# Patient Record
Sex: Female | Born: 1995
Health system: Southern US, Community
[De-identification: ages and names within clinical notes are randomized; demographics above are authoritative.]

## PROBLEM LIST (undated history)

## (undated) DIAGNOSIS — L309 Dermatitis, unspecified: Secondary | ICD-10-CM

## (undated) HISTORY — DX: Dermatitis, unspecified: L30.9

---
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
## 2018-09-27 ENCOUNTER — Other Ambulatory Visit: Payer: Self-pay

## 2018-09-28 ENCOUNTER — Encounter: Payer: Self-pay | Admitting: Women's Health

## 2018-09-28 ENCOUNTER — Ambulatory Visit: Payer: Managed Care, Other (non HMO) | Admitting: Women's Health

## 2018-09-28 VITALS — BP 112/78 | Ht 63.5 in | Wt 135.0 lb

## 2018-09-28 DIAGNOSIS — Z113 Encounter for screening for infections with a predominantly sexual mode of transmission: Secondary | ICD-10-CM

## 2018-09-28 DIAGNOSIS — Z975 Presence of (intrauterine) contraceptive device: Secondary | ICD-10-CM | POA: Diagnosis not present

## 2018-09-28 DIAGNOSIS — Z01419 Encounter for gynecological examination (general) (routine) without abnormal findings: Secondary | ICD-10-CM | POA: Diagnosis not present

## 2018-09-28 DIAGNOSIS — N938 Other specified abnormal uterine and vaginal bleeding: Secondary | ICD-10-CM

## 2018-09-28 LAB — PREGNANCY, URINE: Preg Test, Ur: NEGATIVE

## 2018-09-28 MED ORDER — MEGESTROL ACETATE 40 MG PO TABS: 40.0000 mg | ORAL_TABLET | Freq: Two times a day (BID) | ORAL | 0 refills | Status: DC

## 2018-09-28 NOTE — Patient Instructions (Signed)

## 2018-09-28 NOTE — Progress Notes (Signed)
GWENDOLYNN RAAK 30-Jul-1995 [id number]    History:    Presents for annual exam.  01/2016 Nexplanon placed, first year had a cycle every 3 to 4 months for 1 week second year mostly monthly cycles, this month has had a cycle now for 3 weeks with many heavy days.  Prior to Nexplanon had monthly 5 to 7-day cycles.  Gardasil series completed.  New partner in the past year.  Past medical history, past surgical history, family history and social history were all reviewed and documented in the EPIC chart.  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  Parents healthy.  ROS:  A ROS was performed and pertinent positives and negatives are included.  Exam:  Vitals:   09/28/18 1019  BP: 112/78  Weight: 135 lb (61.2 kg)  Height: 5' 3.5" (1.613 m)   Body mass index is 23.54 kg/m.   General appearance:  Normal Thyroid:  Symmetrical, normal in size, without palpable masses or nodularity. Respiratory  Auscultation:  Clear without wheezing or rhonchi Cardiovascular  Auscultation:  Regular rate, without rubs, murmurs or gallops  Edema/varicosities:  Not grossly evident Abdominal  Soft,nontender, without masses, guarding or rebound.  Liver/spleen:  No organomegaly noted  Hernia:  None appreciated  Skin  Inspection:  Grossly normal   Breasts: Examined lying and sitting.     Right: Without masses, retractions, discharge or axillary adenopathy.     Left: Without masses, retractions, discharge or axillary adenopathy. Gentitourinary   Inguinal/mons:  Normal without inguinal adenopathy  External genitalia:  Normal  BUS/Urethra/Skene's glands:  Normal  Vagina:  Normal, scant menses  Cervix:  Normal  Uterus:  normal in size, shape and contour.  Midline and mobile  Adnexa/parametria:     Rt: Without masses or tenderness.   Lt: Without masses or tenderness.  Anus and perineum: Normal  UPT negative   Assessment/Plan:  23 y.o. SBF G0 for annual exam with current 3-week  cycle  on Nexplanon.  01/2016 Nexplanon placed at family-planning STD screen  Plan: Options reviewed, discussed minimal bleeding today, prescription for Megace 40 mg twice daily for 10 days given if occurs again prior to August when returns to office for removal and replacement.   Other contraception options reviewed would like to proceed with Nexplanon again. Schedule appointment for Nexplanon to be removed and replaced with Dr. Seymour Bars in August.  Condoms encouraged until permanent partner.  SBEs, exercise, calcium rich foods, MVI daily encouraged.  CBC, GC/chlamydia, HIV, RPR.  Pap.  New screening guidelines reviewed.    Harrington Challenger [facility], 10:39 AM 09/28/2018

## 2018-09-29 LAB — PAP IG W/ RFLX HPV ASCU

## 2018-09-29 LAB — C. TRACHOMATIS/N. GONORRHOEAE RNA
C. trachomatis RNA, TMA: NOT DETECTED
N. gonorrhoeae RNA, TMA: NOT DETECTED

## 2018-10-01 ENCOUNTER — Other Ambulatory Visit: Payer: Self-pay | Admitting: *Deleted

## 2018-10-01 MED ORDER — METRONIDAZOLE 500 MG PO TABS: 500.0000 mg | ORAL_TABLET | Freq: Two times a day (BID) | ORAL | 0 refills | Status: DC

## 2018-10-18 ENCOUNTER — Ambulatory Visit: Payer: Managed Care, Other (non HMO) | Admitting: Women's Health

## 2019-02-07 ENCOUNTER — Encounter: Payer: Self-pay | Admitting: Obstetrics & Gynecology

## 2019-02-07 ENCOUNTER — Other Ambulatory Visit: Payer: Self-pay

## 2019-02-07 ENCOUNTER — Ambulatory Visit: Payer: Managed Care, Other (non HMO) | Admitting: Obstetrics & Gynecology

## 2019-02-07 VITALS — BP 110/78

## 2019-02-07 DIAGNOSIS — Z30013 Encounter for initial prescription of injectable contraceptive: Secondary | ICD-10-CM

## 2019-02-07 DIAGNOSIS — Z3009 Encounter for other general counseling and advice on contraception: Secondary | ICD-10-CM

## 2019-02-07 DIAGNOSIS — Z3049 Encounter for surveillance of other contraceptives: Secondary | ICD-10-CM | POA: Diagnosis not present

## 2019-02-07 DIAGNOSIS — N938 Other specified abnormal uterine and vaginal bleeding: Secondary | ICD-10-CM

## 2019-02-07 DIAGNOSIS — Z3046 Encounter for surveillance of implantable subdermal contraceptive: Secondary | ICD-10-CM

## 2019-02-07 MED ORDER — MEDROXYPROGESTERONE ACETATE 150 MG/ML IM SUSP: 150.0000 mg | INTRAMUSCULAR | 4 refills | Status: DC

## 2019-02-07 MED ORDER — MEDROXYPROGESTERONE ACETATE 150 MG/ML IM SUSP
150.0000 mg | Freq: Once | INTRAMUSCULAR | Status: AC
  Administered 2019-02-07: 150 mg via INTRAMUSCULAR

## 2019-02-07 NOTE — Progress Notes (Signed)
° ° °  ROTHA CASSELS October 07, 1995 [id number]        23 y.o.  G0P0000 Single  RP: Nexplanon removal/contraception management  HPI: Nexplanon x 01/2016 with increased BTB recently.  No pelvic pain.  Interested in [place].   OB History  Gravida Para Term Preterm AB Living  0 0 0 0 0 0  SAB TAB Ectopic Multiple Live Births  0 0 0 0 0    Past medical history,surgical history, problem list, medications, allergies, family history and social history were all reviewed and documented in the EPIC chart.   Directed ROS with pertinent positives and negatives documented in the history of present illness/assessment and plan.  Exam:  Vitals:   02/07/19 1449  BP: 110/78   General appearance:  Normal                                                             Nexplanon procedure note (removal)  The patient presented to the office today requesting for removal of her Nexplanon that was placed in the year 2017 on her left arm.   On examination the nexplanon implant was palpated and the distal end  (end  closest to the elbow) was marked. The area was sterilized with Betadine solution. 1% lidocaine was used for local anesthesia and approximately 1 cc  was injected into the site that was marked where the incision was to be made. The local anesthetic was injected under the implant in an effort to keep it  close to the skin surface. Slight pressure pushing downward was made at the proximal end  of the implant in an effort to stabilize it. A bulge appeared indicating the distal end of the implant. A small transverse incision of 2 mm was made at that location. By gently pushing the implant toward the incision, the tip became visible. Grasping the implant with a curved forcep facilitated in gently removing the implant. Full confirmation of the entire implant which is 4 cm long was inspected and was intact and was shown to the patient and discarded. After removing the implant, the incision was closed with  3Steri-Strips, a band-aid and a bandage. Patient will be instructed to remove the pressure bandage in 24 hours, the band-aid in 3 days and the Steri-Strips in 7 days.    Assessment/Plan:  23 y.o. G0  1. Encounter for Nexplanon removal Nexplanon since August 2017, had more breakthrough bleeding recently.  Easy removal of Nexplanon without complication, well-tolerated by patient.  Precautions post removal discussed.  2. Encounter for initial prescription of injectable contraceptive Prefers to change contraception to Depo-Provera injections every 3 months.  First Depo-Provera injection given today.  Usage, benefits and risks discussed with patient.  Prescription sent to pharmacy for next doses.  Other orders - medroxyPROGESTERone (DEPO-PROVERA) 150 MG/ML injection; Inject 1 mL (150 mg total) into the muscle every 3 (three) months.  Counseling on above issues and coordination of care more than 50% for 15 minutes.  Princess Bruins MD, 3:15 PM 02/07/2019

## 2019-02-07 NOTE — Patient Instructions (Signed)
1. Encounter for Nexplanon removal Nexplanon since August 2017, had more breakthrough bleeding recently.  Easy removal of Nexplanon without complication, well-tolerated by patient.  Precautions post removal discussed.  2. Encounter for initial prescription of injectable contraceptive Prefers to change contraception to Depo-Provera injections every 3 months.  First Depo-Provera injection given today.  Usage, benefits and risks discussed with patient.  Prescription sent to pharmacy for next doses.  Other orders - medroxyPROGESTERone (DEPO-PROVERA) 150 MG/ML injection; Inject 1 mL (150 mg total) into the muscle every 3 (three) months.  Bianca Carlson, it was a pleasure meeting you today!

## 2019-02-07 NOTE — Addendum Note (Signed)
Addended by: Thurnell Garbe A on: 02/07/2019 04:05 PM   Modules accepted: Orders

## 2019-04-29 ENCOUNTER — Ambulatory Visit (INDEPENDENT_AMBULATORY_CARE_PROVIDER_SITE_OTHER): Payer: Managed Care, Other (non HMO) | Admitting: Anesthesiology

## 2019-04-29 ENCOUNTER — Other Ambulatory Visit: Payer: Self-pay

## 2019-04-29 DIAGNOSIS — Z3042 Encounter for surveillance of injectable contraceptive: Secondary | ICD-10-CM

## 2019-04-29 MED ORDER — MEDROXYPROGESTERONE ACETATE 150 MG/ML IM SUSP
150.0000 mg | Freq: Once | INTRAMUSCULAR | Status: AC
  Administered 2019-04-29: 09:00:00 150 mg via INTRAMUSCULAR

## 2019-07-22 ENCOUNTER — Other Ambulatory Visit: Payer: Self-pay

## 2019-07-22 ENCOUNTER — Ambulatory Visit (INDEPENDENT_AMBULATORY_CARE_PROVIDER_SITE_OTHER): Payer: Managed Care, Other (non HMO) | Admitting: *Deleted

## 2019-07-22 DIAGNOSIS — Z3042 Encounter for surveillance of injectable contraceptive: Secondary | ICD-10-CM

## 2019-07-22 MED ORDER — MEDROXYPROGESTERONE ACETATE 150 MG/ML IM SUSP
150.0000 mg | Freq: Once | INTRAMUSCULAR | Status: AC
  Administered 2019-07-22: 09:00:00 150 mg via INTRAMUSCULAR

## 2019-09-30 ENCOUNTER — Other Ambulatory Visit: Payer: Self-pay

## 2019-10-03 ENCOUNTER — Ambulatory Visit (INDEPENDENT_AMBULATORY_CARE_PROVIDER_SITE_OTHER): Payer: Self-pay | Admitting: Nurse Practitioner

## 2019-10-03 ENCOUNTER — Other Ambulatory Visit: Payer: Self-pay

## 2019-10-03 ENCOUNTER — Encounter: Payer: Self-pay | Admitting: Nurse Practitioner

## 2019-10-03 VITALS — BP 122/78 | Ht 63.0 in | Wt 151.0 lb

## 2019-10-03 DIAGNOSIS — Z01419 Encounter for gynecological examination (general) (routine) without abnormal findings: Secondary | ICD-10-CM

## 2019-10-03 LAB — CBC WITH DIFFERENTIAL/PLATELET
Absolute Monocytes: 295 cells/uL (ref 200–950)
Basophils Absolute: 21 cells/uL (ref 0–200)
Basophils Relative: 0.5 %
Eosinophils Absolute: 57 cells/uL (ref 15–500)
Eosinophils Relative: 1.4 %
HCT: 38.3 % (ref 35.0–45.0)
Hemoglobin: 12.4 g/dL (ref 11.7–15.5)
Lymphs Abs: 2120 cells/uL (ref 850–3900)
MCH: 28.8 pg (ref 27.0–33.0)
MCHC: 32.4 g/dL (ref 32.0–36.0)
MCV: 88.9 fL (ref 80.0–100.0)
MPV: 11.5 fL (ref 7.5–12.5)
Monocytes Relative: 7.2 %
Neutro Abs: 1607 cells/uL (ref 1500–7800)
Neutrophils Relative %: 39.2 %
Platelets: 224 10*3/uL (ref 140–400)
RBC: 4.31 10*6/uL (ref 3.80–5.10)
RDW: 11.3 % (ref 11.0–15.0)
Total Lymphocyte: 51.7 %
WBC: 4.1 10*3/uL (ref 3.8–10.8)

## 2019-10-03 NOTE — Patient Instructions (Addendum)
Nice to meet you today! Take multivitamin daily Come in next week for Depo injection Follow up in 1 year for annual  Health Maintenance, Female Adopting a healthy lifestyle and getting preventive care are important in promoting health and wellness. Ask your health care provider about:  The right schedule for you to have regular tests and exams.  Things you can do on your own to prevent diseases and keep yourself healthy. What should I know about diet, weight, and exercise? Eat a healthy diet   Eat a diet that includes plenty of vegetables, fruits, low-fat dairy products, and lean protein.  Do not eat a lot of foods that are high in solid fats, added sugars, or sodium. Maintain a healthy weight Body mass index (BMI) is used to identify weight problems. It estimates body fat based on height and weight. Your health care provider can help determine your BMI and help you achieve or maintain a healthy weight. Get regular exercise Get regular exercise. This is one of the most important things you can do for your health. Most adults should:  Exercise for at least 150 minutes each week. The exercise should increase your heart rate and make you sweat (moderate-intensity exercise).  Do strengthening exercises at least twice a week. This is in addition to the moderate-intensity exercise.  Spend less time sitting. Even light physical activity can be beneficial. Watch cholesterol and blood lipids Have your blood tested for lipids and cholesterol at 23 years of age, then have this test every 5 years. Have your cholesterol levels checked more often if:  Your lipid or cholesterol levels are high.  You are older than 23 years of age.  You are at high risk for heart disease. What should I know about cancer screening? Depending on your health history and family history, you may need to have cancer screening at various ages. This may include screening for:  Breast cancer.  Cervical  cancer.  Colorectal cancer.  Skin cancer.  Lung cancer. What should I know about heart disease, diabetes, and high blood pressure? Blood pressure and heart disease  High blood pressure causes heart disease and increases the risk of stroke. This is more likely to develop in people who have high blood pressure readings, are of African descent, or are overweight.  Have your blood pressure checked: ? Every 3-5 years if you are 23-23 years of age. ? Every year if you are 4 years old or older. Diabetes Have regular diabetes screenings. This checks your fasting blood sugar level. Have the screening done:  Once every three years after age 72 if you are at a normal weight and have a low risk for diabetes.  More often and at a younger age if you are overweight or have a high risk for diabetes. What should I know about preventing infection? Hepatitis B If you have a higher risk for hepatitis B, you should be screened for this virus. Talk with your health care provider to find out if you are at risk for hepatitis B infection. Hepatitis C Testing is recommended for:  Everyone born from [birth year] through 1964.  Anyone with known risk factors for hepatitis C. Sexually transmitted infections (STIs)  Get screened for STIs, including gonorrhea and chlamydia, if: ? You are sexually active and are younger than 23 years of age. ? You are older than 23 years of age and your health care provider tells you that you are at risk for this type of infection. ? Your sexual activity  has changed since you were last screened, and you are at increased risk for chlamydia or gonorrhea. Ask your health care provider if you are at risk.  Ask your health care provider about whether you are at high risk for HIV. Your health care provider may recommend a prescription medicine to help prevent HIV infection. If you choose to take medicine to prevent HIV, you should first get tested for HIV. You should then be tested every 3  months for as long as you are taking the medicine. Pregnancy  If you are about to stop having your period (premenopausal) and you may become pregnant, seek counseling before you get pregnant.  Take 400 to 800 micrograms (mcg) of folic acid every day if you become pregnant.  Ask for birth control (contraception) if you want to prevent pregnancy. Osteoporosis and menopause Osteoporosis is a disease in which the bones lose minerals and strength with aging. This can result in bone fractures. If you are 61 years old or older, or if you are at risk for osteoporosis and fractures, ask your health care provider if you should:  Be screened for bone loss.  Take a calcium or vitamin D supplement to lower your risk of fractures.  Be given hormone replacement therapy (HRT) to treat symptoms of menopause. Follow these instructions at home: Lifestyle  Do not use any products that contain nicotine or tobacco, such as cigarettes, e-cigarettes, and chewing tobacco. If you need help quitting, ask your health care provider.  Do not use street drugs.  Do not share needles.  Ask your health care provider for help if you need support or information about quitting drugs. Alcohol use  Do not drink alcohol if: ? Your health care provider tells you not to drink. ? You are pregnant, may be pregnant, or are planning to become pregnant.  If you drink alcohol: ? Limit how much you use to 0-1 drink a day. ? Limit intake if you are breastfeeding.  Be aware of how much alcohol is in your drink. In the [place] one drink equals one 12 oz bottle of beer (355 mL), one 5 oz glass of wine (148 mL), or one 1 oz glass of hard liquor (44 mL). General instructions  Schedule regular health, dental, and eye exams.  Stay current with your vaccines.  Tell your health care provider if: ? You often feel depressed. ? You have ever been abused or do not feel safe at home. Summary  Adopting a healthy lifestyle and getting  preventive care are important in promoting health and wellness.  Follow your health care provider's instructions about healthy diet, exercising, and getting tested or screened for diseases.  Follow your health care provider's instructions on monitoring your cholesterol and blood pressure. This information is not intended to replace advice given to you by your health care provider. Make sure you discuss any questions you have with your health care provider. Document Revised: 05/26/2018 Document Reviewed: 05/26/2018 Elsevier Patient Education  [place]. Medroxyprogesterone injection [Contraceptive] What is this medicine? MEDROXYPROGESTERONE (me DROX ee proe JES te rone) contraceptive injections prevent pregnancy. They provide effective birth control for 3 months. Depo-subQ Provera 104 is also used for treating pain related to endometriosis. This medicine may be used for other purposes; ask your health care provider or pharmacist if you have questions. COMMON BRAND NAME(S): Depo-Provera, Depo-subQ Provera 104 What should I tell my health care provider before I take this medicine? They need to know if you have any  of these conditions:  frequently drink alcohol  asthma  blood vessel disease or a history of a blood clot in the lungs or legs  bone disease such as osteoporosis  breast cancer  diabetes  eating disorder (anorexia nervosa or bulimia)  high blood pressure  HIV infection or AIDS  kidney disease  liver disease  mental depression  migraine  seizures (convulsions)  stroke  tobacco smoker  vaginal bleeding  an unusual or allergic reaction to medroxyprogesterone, other hormones, medicines, foods, dyes, or preservatives  pregnant or trying to get pregnant  breast-feeding How should I use this medicine? Depo-Provera Contraceptive injection is given into a muscle. Depo-subQ Provera 104 injection is given under the skin. These injections are given by a  health care professional. You must not be pregnant before getting an injection. The injection is usually given during the first 5 days after the start of a menstrual period or 6 weeks after delivery of a baby. Talk to your pediatrician regarding the use of this medicine in children. Special care may be needed. These injections have been used in female children who have started having menstrual periods. Overdosage: If you think you have taken too much of this medicine contact a poison control center or emergency room at once. NOTE: This medicine is only for you. Do not share this medicine with others. What if I miss a dose? Try not to miss a dose. You must get an injection once every 3 months to maintain birth control. If you cannot keep an appointment, call and reschedule it. If you wait longer than 13 weeks between Depo-Provera contraceptive injections or longer than 14 weeks between Depo-subQ Provera 104 injections, you could get pregnant. Use another method for birth control if you miss your appointment. You may also need a pregnancy test before receiving another injection. What may interact with this medicine? Do not take this medicine with any of the following medications:  bosentan This medicine may also interact with the following medications:  aminoglutethimide  antibiotics or medicines for infections, especially rifampin, rifabutin, rifapentine, and griseofulvin  aprepitant  barbiturate medicines such as phenobarbital or primidone  bexarotene  carbamazepine  medicines for seizures like ethotoin, felbamate, oxcarbazepine, phenytoin, topiramate  modafinil  St. John's wort This list may not describe all possible interactions. Give your health care provider a list of all the medicines, herbs, non-prescription drugs, or dietary supplements you use. Also tell them if you smoke, drink alcohol, or use illegal drugs. Some items may interact with your medicine. What should I watch for  while using this medicine? This drug does not protect you against HIV infection (AIDS) or other sexually transmitted diseases. Use of this product may cause you to lose calcium from your bones. Loss of calcium may cause weak bones (osteoporosis). Only use this product for more than 2 years if other forms of birth control are not right for you. The longer you use this product for birth control the more likely you will be at risk for weak bones. Ask your health care professional how you can keep strong bones. You may have a change in bleeding pattern or irregular periods. Many females stop having periods while taking this drug. If you have received your injections on time, your chance of being pregnant is very low. If you think you may be pregnant, see your health care professional as soon as possible. Tell your health care professional if you want to get pregnant within the next year. The effect of this  medicine may last a long time after you get your last injection. What side effects may I notice from receiving this medicine? Side effects that you should report to your doctor or health care professional as soon as possible:  allergic reactions like skin rash, itching or hives, swelling of the face, lips, or tongue  breast tenderness or discharge  breathing problems  changes in vision  depression  feeling faint or lightheaded, falls  fever  pain in the abdomen, chest, groin, or leg  problems with balance, talking, walking  unusually weak or tired  yellowing of the eyes or skin Side effects that usually do not require medical attention (report to your doctor or health care professional if they continue or are bothersome):  acne  fluid retention and swelling  headache  irregular periods, spotting, or absent periods  temporary pain, itching, or skin reaction at site where injected  weight gain This list may not describe all possible side effects. Call your doctor for medical  advice about side effects. You may report side effects to FDA at 1-800-FDA-1088. Where should I keep my medicine? This does not apply. The injection will be given to you by a health care professional. NOTE: This sheet is a summary. It may not cover all possible information. If you have questions about this medicine, talk to your doctor, pharmacist, or health care provider.  2020 Elsevier/Gold Standard (2008-06-23 18:37:56)

## 2019-10-03 NOTE — Progress Notes (Signed)
MEI SUITS 1995-07-22 [id number]    History:    24 y.o. G0G0 presents for annual exam without GYN complaints. Depo Provera for contraception. Gardasil completed. Pap 09/28/2018 normal. Not sexually active, no need for STD screening today. Started a new job in February, insurance will start next month. Depo due after 4/23.   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   Exam:  Vitals:   10/03/19 0937  BP: 122/78  Weight: 151 lb (68.5 kg)  Height: 5\' 3"  (1.6 m)   Body mass index is 26.75 kg/m.   General appearance:  Normal Thyroid:  Symmetrical, normal in size, without palpable masses or nodularity. Respiratory  Auscultation:  Clear without wheezing or rhonchi Cardiovascular  Auscultation:  Regular rate, without rubs, murmurs or gallops  Edema/varicosities:  Not grossly evident Abdominal  Soft,nontender, without masses, guarding or rebound.  Liver/spleen:  No organomegaly noted  Hernia:  None appreciated  Skin  Inspection:  Grossly normal   Breasts: Examined lying and sitting.     Right: Without masses, retractions, discharge or axillary adenopathy.     Left: Without masses, retractions, discharge or axillary adenopathy. Gentitourinary   Inguinal/mons:  Normal without inguinal adenopathy  External genitalia:  Normal  BUS/Urethra/Skene's glands:  Normal  Vagina:  Normal  Cervix:  Normal  Uterus:  Anteverted, normal in size, shape and contour.  Midline and mobile  Adnexa/parametria:     Rt: Without masses or tenderness.   Lt: Without masses or tenderness.  Anus and perineum: Normal  Assessment:  Contraception: Depo Provera Health maintenance STD prevention  Plan:  24 y.o.  for annual exam. Education provided on SBEs, STD prevention, daily multivitamin, exercise, and  healthy eating.  Return in 1 week for Depo shot. CBC pending. Follow up in 1 year for annual.     [address] [facility], 10:10 AM 10/03/2019

## 2019-10-11 ENCOUNTER — Ambulatory Visit (INDEPENDENT_AMBULATORY_CARE_PROVIDER_SITE_OTHER): Payer: Self-pay | Admitting: *Deleted

## 2019-10-11 ENCOUNTER — Other Ambulatory Visit: Payer: Self-pay

## 2019-10-11 DIAGNOSIS — Z3042 Encounter for surveillance of injectable contraceptive: Secondary | ICD-10-CM

## 2019-10-11 MED ORDER — MEDROXYPROGESTERONE ACETATE 150 MG/ML IM SUSP
150.0000 mg | Freq: Once | INTRAMUSCULAR | Status: AC
  Administered 2019-10-11: 150 mg via INTRAMUSCULAR

## 2020-08-07 DIAGNOSIS — H6092 Unspecified otitis externa, left ear: Secondary | ICD-10-CM | POA: Diagnosis not present

## 2020-08-07 DIAGNOSIS — J029 Acute pharyngitis, unspecified: Secondary | ICD-10-CM | POA: Diagnosis not present

## 2020-08-07 DIAGNOSIS — H9202 Otalgia, left ear: Secondary | ICD-10-CM | POA: Diagnosis not present

## 2020-10-01 DIAGNOSIS — L2084 Intrinsic (allergic) eczema: Secondary | ICD-10-CM | POA: Diagnosis not present

## 2020-11-06 ENCOUNTER — Other Ambulatory Visit: Payer: Self-pay

## 2020-11-06 ENCOUNTER — Ambulatory Visit (INDEPENDENT_AMBULATORY_CARE_PROVIDER_SITE_OTHER): Payer: BC Managed Care – PPO | Admitting: Nurse Practitioner

## 2020-11-06 ENCOUNTER — Encounter: Payer: Self-pay | Admitting: Nurse Practitioner

## 2020-11-06 VITALS — BP 116/74 | Ht 64.0 in | Wt 163.0 lb

## 2020-11-06 DIAGNOSIS — N920 Excessive and frequent menstruation with regular cycle: Secondary | ICD-10-CM | POA: Diagnosis not present

## 2020-11-06 DIAGNOSIS — Z01419 Encounter for gynecological examination (general) (routine) without abnormal findings: Secondary | ICD-10-CM | POA: Diagnosis not present

## 2020-11-06 DIAGNOSIS — N946 Dysmenorrhea, unspecified: Secondary | ICD-10-CM

## 2020-11-06 DIAGNOSIS — Z3009 Encounter for other general counseling and advice on contraception: Secondary | ICD-10-CM

## 2020-11-06 NOTE — Patient Instructions (Signed)
Health Maintenance, Female Adopting a healthy lifestyle and getting preventive care are important in promoting health and wellness. Ask your health care provider about:  The right schedule for you to have regular tests and exams.  Things you can do on your own to prevent diseases and keep yourself healthy. What should I know about diet, weight, and exercise? Eat a healthy diet  Eat a diet that includes plenty of vegetables, fruits, low-fat dairy products, and lean protein.  Do not eat a lot of foods that are high in solid fats, added sugars, or sodium.   Maintain a healthy weight Body mass index (BMI) is used to identify weight problems. It estimates body fat based on height and weight. Your health care provider can help determine your BMI and help you achieve or maintain a healthy weight. Get regular exercise Get regular exercise. This is one of the most important things you can do for your health. Most adults should:  Exercise for at least 150 minutes each week. The exercise should increase your heart rate and make you sweat (moderate-intensity exercise).  Do strengthening exercises at least twice a week. This is in addition to the moderate-intensity exercise.  Spend less time sitting. Even light physical activity can be beneficial. Watch cholesterol and blood lipids Have your blood tested for lipids and cholesterol at 24 years of age, then have this test every 5 years. Have your cholesterol levels checked more often if:  Your lipid or cholesterol levels are high.  You are older than 24 years of age.  You are at high risk for heart disease. What should I know about cancer screening? Depending on your health history and family history, you may need to have cancer screening at various ages. This may include screening for:  Breast cancer.  Cervical cancer.  Colorectal cancer.  Skin cancer.  Lung cancer. What should I know about heart disease, diabetes, and high blood  pressure? Blood pressure and heart disease  High blood pressure causes heart disease and increases the risk of stroke. This is more likely to develop in people who have high blood pressure readings, are of African descent, or are overweight.  Have your blood pressure checked: ? Every 3-5 years if you are 18-39 years of age. ? Every year if you are 40 years old or older. Diabetes Have regular diabetes screenings. This checks your fasting blood sugar level. Have the screening done:  Once every three years after age 40 if you are at a normal weight and have a low risk for diabetes.  More often and at a younger age if you are overweight or have a high risk for diabetes. What should I know about preventing infection? Hepatitis B If you have a higher risk for hepatitis B, you should be screened for this virus. Talk with your health care provider to find out if you are at risk for hepatitis B infection. Hepatitis C Testing is recommended for:  Everyone born from 1945 through 1965.  Anyone with known risk factors for hepatitis C. Sexually transmitted infections (STIs)  Get screened for STIs, including gonorrhea and chlamydia, if: ? You are sexually active and are younger than 24 years of age. ? You are older than 24 years of age and your health care provider tells you that you are at risk for this type of infection. ? Your sexual activity has changed since you were last screened, and you are at increased risk for chlamydia or gonorrhea. Ask your health care provider   if you are at risk.  Ask your health care provider about whether you are at high risk for HIV. Your health care provider may recommend a prescription medicine to help prevent HIV infection. If you choose to take medicine to prevent HIV, you should first get tested for HIV. You should then be tested every 3 months for as long as you are taking the medicine. Pregnancy  If you are about to stop having your period (premenopausal) and  you may become pregnant, seek counseling before you get pregnant.  Take 400 to 800 micrograms (mcg) of folic acid every day if you become pregnant.  Ask for birth control (contraception) if you want to prevent pregnancy. Osteoporosis and menopause Osteoporosis is a disease in which the bones lose minerals and strength with aging. This can result in bone fractures. If you are 65 years old or older, or if you are at risk for osteoporosis and fractures, ask your health care provider if you should:  Be screened for bone loss.  Take a calcium or vitamin D supplement to lower your risk of fractures.  Be given hormone replacement therapy (HRT) to treat symptoms of menopause. Follow these instructions at home: Lifestyle  Do not use any products that contain nicotine or tobacco, such as cigarettes, e-cigarettes, and chewing tobacco. If you need help quitting, ask your health care provider.  Do not use street drugs.  Do not share needles.  Ask your health care provider for help if you need support or information about quitting drugs. Alcohol use  Do not drink alcohol if: ? Your health care provider tells you not to drink. ? You are pregnant, may be pregnant, or are planning to become pregnant.  If you drink alcohol: ? Limit how much you use to 0-1 drink a day. ? Limit intake if you are breastfeeding.  Be aware of how much alcohol is in your drink. In the [place] one drink equals one 12 oz bottle of beer (355 mL), one 5 oz glass of wine (148 mL), or one 1 oz glass of hard liquor (44 mL). General instructions  Schedule regular health, dental, and eye exams.  Stay current with your vaccines.  Tell your health care provider if: ? You often feel depressed. ? You have ever been abused or do not feel safe at home. Summary  Adopting a healthy lifestyle and getting preventive care are important in promoting health and wellness.  Follow your health care provider's instructions about healthy  diet, exercising, and getting tested or screened for diseases.  Follow your health care provider's instructions on monitoring your cholesterol and blood pressure. This information is not intended to replace advice given to you by your health care provider. Make sure you discuss any questions you have with your health care provider. Document Revised: 05/26/2018 Document Reviewed: 05/26/2018 Elsevier Patient Education  2021 Elsevier Inc.  

## 2020-11-06 NOTE — Progress Notes (Signed)
   Bianca Carlson 1995/12/16 [id number]   History:  25 y.o. G0 presents for annual exam. Monthly cycle. Had Nexplanon in the past that did well managing menorrhagia and dysmenorrhea. She tried Depo Provera last year and experienced weight gain. She would like Nexplanon again. Not currently sexually active. Normal pap history. Has received Gardasil series.   Gynecologic History Patient's last menstrual period was 11/02/2020. Period Cycle (Days): 28 Period Duration (Days): 4 Period Pattern: Regular Menstrual Flow: Heavy Dysmenorrhea: (!) Severe Dysmenorrhea Symptoms: Cramping Contraception/Family planning: abstinence  Health Maintenance Last Pap: 09/28/2018. 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   ROS:  A ROS was performed and pertinent positives and negatives are included.  Exam:  Vitals:   11/06/20 1059  BP: 116/74  Weight: 163 lb (73.9 kg)  Height: 5\' 4"  (1.626 m)   Body mass index is 27.98 kg/m.  General appearance:  Normal Thyroid:  Symmetrical, normal in size, without palpable masses or nodularity. Respiratory  Auscultation:  Clear without wheezing or rhonchi Cardiovascular  Auscultation:  Regular rate, without rubs, murmurs or gallops  Edema/varicosities:  Not grossly evident Abdominal  Soft,nontender, without masses, guarding or rebound.  Liver/spleen:  No organomegaly noted  Hernia:  None appreciated  Skin  Inspection:  Grossly normal Breasts: Examined lying and sitting.   Right: Without masses, retractions, nipple discharge or axillary adenopathy.   Left: Without masses, retractions, nipple discharge or axillary adenopathy. Genitourinary   Inguinal/mons:  Normal without inguinal adenopathy  External genitalia:  Normal appearing vulva with no  masses, tenderness, or lesions  BUS/Urethra/Skene's glands:  Normal  Vagina:  Normal appearing with normal color and discharge, no lesions. Bleeding from menses  Cervix:  Normal appearing without discharge or lesions  Uterus:  Normal in size, shape and contour.  Midline and mobile, nontender  Adnexa/parametria:     Rt: Normal in size, without masses or tenderness.   Lt: Normal in size, without masses or tenderness.  Anus and perineum: Normal  Assessment/Plan:  25 y.o. G0 for annual exam.   Well female exam with routine gynecological exam - Education provided on SBEs, importance of preventative screenings, current guidelines, high calcium diet, regular exercise, and multivitamin daily.   General counseling and advice on female contraception - Plan: Insertion of implanon rod. Contraceptive options were reviewed, including hormonal methods, both combination (pill, patch, vaginal ring) and progesterone-only (pill, Depo Provera and Nexplanon), intrauterine devices (Mirena, [place], [place], and [place]), and barrier methods. The mechanisms, risks, benefits and side effects of all methods were discussed. She has had Nexplanon in the past and would like this again.   Menorrhagia with regular cycle - Plan: Insertion of implanon rod. Had Nexplanon in the past that did well managing menorrhagia and dysmenorrhea. She tried Depo Provera last year and experienced weight gain. She would like Nexplanon again. Not currently sexually active. She will schedule appointment for Nexplanon insertion.   Dysmenorrhea - Plan: Insertion of implanon rod (see above)  Screening for cervical cancer - normal pap history. Will repeat at 3-year interval per guidelines.   Return in 1 year for annual.    [place] DNP, 11:10 AM 11/06/2020

## 2020-11-13 ENCOUNTER — Other Ambulatory Visit: Payer: Self-pay

## 2020-11-13 ENCOUNTER — Ambulatory Visit (INDEPENDENT_AMBULATORY_CARE_PROVIDER_SITE_OTHER): Payer: BC Managed Care – PPO | Admitting: Nurse Practitioner

## 2020-11-13 ENCOUNTER — Encounter: Payer: Self-pay | Admitting: Gynecology

## 2020-11-13 ENCOUNTER — Encounter: Payer: Self-pay | Admitting: Nurse Practitioner

## 2020-11-13 VITALS — BP 118/76

## 2020-11-13 DIAGNOSIS — Z30017 Encounter for initial prescription of implantable subdermal contraceptive: Secondary | ICD-10-CM

## 2020-11-13 DIAGNOSIS — Z3009 Encounter for other general counseling and advice on contraception: Secondary | ICD-10-CM

## 2020-11-13 NOTE — Progress Notes (Signed)
25 y.o. G0P0000 presents for Nexplanon insertion.  She has been counseled about alternative types of contraception and has decided this long acting method is the best for her. She has had Nexplanon in the past and did well with it. She was on Depo Provera for the past year. Procedure, risks and benefits have all been explained. She was sexually active 2 days ago with condom use.   LMP:  11/02/2020  After all questions were answered, consent was obtained.    Past Medical History:  Diagnosis Date  . Eczema     No past surgical history on file.  Current Outpatient Medications on File Prior to Visit  Medication Sig Dispense Refill  . hydrocortisone 2.5 % ointment Apply topically.     No current facility-administered medications on file prior to visit.   No Known Allergies  Vitals:   11/13/20 1200  BP: 118/76   Physical Exam Constitutional:      Appearance: Normal appearance.     Procedure: Patient placed supine on exam table with her left arm flexed at the elbow. The location for insertion site was identified 8-10 cm from epicondyle, and 3-4 cm posterior.  Area cleansed with Betadine x 2.  Insertion site and path of insertion was anesthetized with 1% Lidocaine without epinephrine, 1.5 cc total.  Using Nexplanon device (and after confirming present of rod in device), skin was pierced and then elevated along insertion path, passing device just under the skin.  Rod released and device inserted.  Rod palpated easily.  Steri-strips applied and a pressure bandage was place over the site.  Entire procedure performed with sterile technique.  Lot:  [id number]  Expiration: 11/14/2023  Assessment: Nexplanon insertion.  Plan:  Post procedure instructions reviewed with pt.  Questions answered.  Pt knows to call with any concerns or questions.  Pt is aware removal is due by 3 calendar years from today. It is recommended she use backup contraception for 7 days or avoid intercourse since she is due for  ovulation in a few days. She is agreeable.

## 2020-11-27 DIAGNOSIS — Z20822 Contact with and (suspected) exposure to covid-19: Secondary | ICD-10-CM | POA: Diagnosis not present

## 2020-12-26 ENCOUNTER — Ambulatory Visit: Payer: BC Managed Care – PPO | Admitting: Nurse Practitioner

## 2020-12-26 ENCOUNTER — Encounter: Payer: Self-pay | Admitting: Nurse Practitioner

## 2020-12-26 ENCOUNTER — Other Ambulatory Visit: Payer: Self-pay

## 2020-12-26 VITALS — BP 100/58 | HR 84

## 2020-12-26 DIAGNOSIS — B3731 Acute candidiasis of vulva and vagina: Secondary | ICD-10-CM

## 2020-12-26 DIAGNOSIS — Z113 Encounter for screening for infections with a predominantly sexual mode of transmission: Secondary | ICD-10-CM | POA: Diagnosis not present

## 2020-12-26 DIAGNOSIS — B373 Candidiasis of vulva and vagina: Secondary | ICD-10-CM | POA: Diagnosis not present

## 2020-12-26 DIAGNOSIS — N898 Other specified noninflammatory disorders of vagina: Secondary | ICD-10-CM

## 2020-12-26 LAB — WET PREP FOR TRICH, YEAST, CLUE

## 2020-12-26 MED ORDER — FLUCONAZOLE 150 MG PO TABS
150.0000 mg | ORAL_TABLET | ORAL | 0 refills | Status: DC
Start: 2020-12-26 — End: 2021-11-07

## 2020-12-26 NOTE — Progress Notes (Signed)
   Acute Office Visit  Subjective:    Patient ID: Bianca Carlson, female    DOB: [birth year], 25 y.o.   MRN: [id number]   HPI 25 y.o. presents today for vaginal itching, discharge and odor that started 1 week ago after she returned from her cruise. Sexually active, would like STD screening. Nexplanon for contraception.    Review of Systems  Constitutional: Negative.   Genitourinary:  Positive for vaginal discharge.       Vaginal itching      Objective:    Physical Exam Constitutional:      Appearance: Normal appearance.  Genitourinary:    General: Normal vulva.     Vagina: Vaginal discharge and erythema present.     Cervix: Normal.    BP (!) 100/58   Pulse 84   LMP 10/08/2020 (Exact Date) Comment: Nexplanon for birth control Wt Readings from Last 3 Encounters:  11/06/20 163 lb (73.9 kg)  10/03/19 151 lb (68.5 kg)  09/28/18 135 lb (61.2 kg)   Wet prep + yeast (hyphae noted)     Assessment & Plan:   Problem List Items Addressed This Visit   None Visit Diagnoses     Vaginal candidiasis    -  Primary   Relevant Medications   fluconazole (DIFLUCAN) 150 MG tablet   Vaginal itching       Relevant Orders   WET PREP FOR TRICH, YEAST, CLUE   Screen for STD (sexually transmitted disease)       Relevant Orders   SURESWAB CT/NG/T. vaginalis   RPR   HIV Antibody (routine testing w rflx)      Plan: Wet prep positive for yeast - Diflucan 150 mg today and repeat dose in 3 days if symptoms persist for total of 2 doses. Recommend changing out of wet bathing suits to avoid recurrences. STD panel pending. She is agreeable to plan.      Olivia Mackie DNP, 9:35 AM 12/26/2020

## 2020-12-27 LAB — SURESWAB CT/NG/T. VAGINALIS
C. trachomatis RNA, TMA: NOT DETECTED
N. gonorrhoeae RNA, TMA: NOT DETECTED
Trichomonas vaginalis RNA: NOT DETECTED

## 2020-12-27 LAB — RPR: RPR Ser Ql: NONREACTIVE

## 2020-12-27 LAB — HIV ANTIBODY (ROUTINE TESTING W REFLEX): HIV 1&2 Ab, 4th Generation: NONREACTIVE

## 2021-07-19 DIAGNOSIS — J039 Acute tonsillitis, unspecified: Secondary | ICD-10-CM | POA: Diagnosis not present

## 2021-09-09 DIAGNOSIS — L03011 Cellulitis of right finger: Secondary | ICD-10-CM | POA: Diagnosis not present

## 2021-11-07 ENCOUNTER — Encounter: Payer: Self-pay | Admitting: Nurse Practitioner

## 2021-11-07 ENCOUNTER — Other Ambulatory Visit (HOSPITAL_COMMUNITY)
Admission: RE | Admit: 2021-11-07 | Discharge: 2021-11-07 | Disposition: A | Discharge status: Home / Self Care | Payer: BC Managed Care – PPO | Source: Ambulatory Visit | Attending: Nurse Practitioner | Admitting: Nurse Practitioner

## 2021-11-07 ENCOUNTER — Ambulatory Visit (INDEPENDENT_AMBULATORY_CARE_PROVIDER_SITE_OTHER): Payer: BC Managed Care – PPO | Admitting: Nurse Practitioner

## 2021-11-07 VITALS — BP 116/74 | Ht 63.0 in | Wt 177.0 lb

## 2021-11-07 DIAGNOSIS — Z113 Encounter for screening for infections with a predominantly sexual mode of transmission: Secondary | ICD-10-CM | POA: Insufficient documentation

## 2021-11-07 DIAGNOSIS — Z01419 Encounter for gynecological examination (general) (routine) without abnormal findings: Secondary | ICD-10-CM

## 2021-11-07 DIAGNOSIS — Z3046 Encounter for surveillance of implantable subdermal contraceptive: Secondary | ICD-10-CM

## 2021-11-07 NOTE — Progress Notes (Signed)
   Bianca Carlson December 13, 1995 [id number]   History:  26 y.o. G0 presents for annual exam. Nexplanon 10/2020. Occasional spotting. Normal pap history. Has received Gardasil series.   Gynecologic History No LMP recorded (lmp unknown).   Contraception/Family planning: Nexplanon Sexually active: Yes  Health Maintenance Last Pap: 09/28/2018. 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  Exam:  Vitals:   11/07/21 0923  BP: 116/74  Weight: 177 lb (80.3 kg)  Height: 5\' 3"  (1.6 m)    Body mass index is 31.35 kg/m.  General appearance:  Normal Thyroid:  Symmetrical, normal in size, without palpable masses or nodularity. Respiratory  Auscultation:  Clear without wheezing or rhonchi Cardiovascular  Auscultation:  Regular rate, without rubs, murmurs or gallops  Edema/varicosities:  Not grossly evident Abdominal  Soft,nontender, without masses, guarding or rebound.  Liver/spleen:  No organomegaly noted  Hernia:  None appreciated  Skin  Inspection:  Grossly normal Breasts: Examined lying and sitting.   Right: Without masses, retractions, nipple discharge or axillary adenopathy.   Left: Without masses, retractions, nipple discharge or axillary adenopathy. Genitourinary   Inguinal/mons:  Normal without inguinal adenopathy  External genitalia:  Normal appearing vulva with no masses, tenderness, or lesions  BUS/Urethra/Skene's glands:  Normal  Vagina:  Normal appearing with normal color and discharge, no lesions.  Cervix:  Normal appearing without discharge or lesions  Uterus:  Normal in size, shape and contour.  Midline and mobile, nontender  Adnexa/parametria:     Rt: Normal in size, without  masses or tenderness.   Lt: Normal in size, without masses or tenderness.  Anus and perineum: Normal  Patient informed chaperone available to be present for breast and pelvic exam. Patient has requested no chaperone to be present. Patient has been advised what will be completed during breast and pelvic exam.   Assessment/Plan:  26 y.o. G0 for annual exam.   Well female exam with routine gynecological exam - Plan: Cytology - PAP( [place]). Education provided on SBEs, importance of preventative screenings, current guidelines, high calcium diet, regular exercise, and multivitamin daily.   Encounter for surveillance of implantable subdermal contraceptive - Nexplanon 10/2020. Occasional spotting.   Screen for STD (sexually transmitted disease) - Plan: Cytology - PAP( [place]), RPR, HIV Antibody (routine testing w rflx). GC/chlamydia/trich added to pap.   Screening for cervical cancer - Normal pap history. Pap today.   Return in 1 year for annual.      Tamela Gammon DNP, 9:44 AM 11/07/2021

## 2021-11-08 LAB — RPR: RPR Ser Ql: NONREACTIVE

## 2021-11-08 LAB — HIV ANTIBODY (ROUTINE TESTING W REFLEX): HIV 1&2 Ab, 4th Generation: NONREACTIVE

## 2021-11-12 LAB — CYTOLOGY - PAP
Chlamydia: NEGATIVE
Comment: NEGATIVE
Comment: NEGATIVE
Comment: NORMAL
Diagnosis: NEGATIVE
Neisseria Gonorrhea: NEGATIVE
Trichomonas: NEGATIVE

## 2021-11-18 ENCOUNTER — Telehealth: Payer: Self-pay

## 2021-11-18 ENCOUNTER — Other Ambulatory Visit: Payer: Self-pay | Admitting: Nurse Practitioner

## 2021-11-18 DIAGNOSIS — Z7251 High risk heterosexual behavior: Secondary | ICD-10-CM

## 2021-11-18 MED ORDER — LEVONORGESTREL 1.5 MG PO TABS: 1.5000 mg | ORAL_TABLET | Freq: Once | ORAL | 0 refills | Status: AC

## 2021-11-18 NOTE — Telephone Encounter (Signed)
Pt calling to report recent unprotected IC with partner and is nervous about the 1% possibility of conceiving with Nexplanon and would feel better if took a plan B just in case but wanted to get your input on if it would/could "throw" things off balance bleeding/cycle wise. Please advise.

## 2021-11-18 NOTE — Telephone Encounter (Signed)
Pt notified and voiced understanding. She said she would like you to send it in. Thanks.

## 2021-11-18 NOTE — Telephone Encounter (Signed)
It is OK to take Plan B with Nexplanon if she wants the reassurance. I can send this into her pharmacy if she prefers or she can get OTC.

## 2021-12-18 DIAGNOSIS — N390 Urinary tract infection, site not specified: Secondary | ICD-10-CM | POA: Diagnosis not present

## 2021-12-18 DIAGNOSIS — R350 Frequency of micturition: Secondary | ICD-10-CM | POA: Diagnosis not present

## 2021-12-18 DIAGNOSIS — R300 Dysuria: Secondary | ICD-10-CM | POA: Diagnosis not present

## 2022-01-07 DIAGNOSIS — L2089 Other atopic dermatitis: Secondary | ICD-10-CM | POA: Diagnosis not present

## 2022-01-21 DIAGNOSIS — L2089 Other atopic dermatitis: Secondary | ICD-10-CM | POA: Diagnosis not present

## 2022-02-04 DIAGNOSIS — L2089 Other atopic dermatitis: Secondary | ICD-10-CM | POA: Diagnosis not present

## 2022-07-21 DIAGNOSIS — R490 Dysphonia: Secondary | ICD-10-CM | POA: Diagnosis not present

## 2022-07-21 DIAGNOSIS — R059 Cough, unspecified: Secondary | ICD-10-CM | POA: Diagnosis not present

## 2022-07-21 DIAGNOSIS — J069 Acute upper respiratory infection, unspecified: Secondary | ICD-10-CM | POA: Diagnosis not present

## 2022-07-21 DIAGNOSIS — J40 Bronchitis, not specified as acute or chronic: Secondary | ICD-10-CM | POA: Diagnosis not present

## 2022-11-18 ENCOUNTER — Ambulatory Visit: Payer: BC Managed Care – PPO | Admitting: Nurse Practitioner

## 2022-11-18 NOTE — Progress Notes (Deleted)
   Bianca Carlson [birth year] [id number]   History:  27 y.o. G0 presents for annual exam. Nexplanon 10/2020. Occasional spotting. Normal pap history. Has received Gardasil series.   Gynecologic History No LMP recorded.   Contraception/Family planning: Nexplanon Sexually active: Yes  Health Maintenance Last Pap: 11/07/2021. 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  Exam:  There were no vitals filed for this visit.   There is no height or weight on file to calculate BMI.  General appearance:  Normal Thyroid:  Symmetrical, normal in size, without palpable masses or nodularity. Respiratory  Auscultation:  Clear without wheezing or rhonchi Cardiovascular  Auscultation:  Regular rate, without rubs, murmurs or gallops  Edema/varicosities:  Not grossly evident Abdominal  Soft,nontender, without masses, guarding or rebound.  Liver/spleen:  No organomegaly noted  Hernia:  None appreciated  Skin  Inspection:  Grossly normal Breasts: Examined lying and sitting.   Right: Without masses, retractions, nipple discharge or axillary adenopathy.   Left: Without masses, retractions, nipple discharge or axillary adenopathy. Genitourinary   Inguinal/mons:  Normal without inguinal adenopathy  External genitalia:  Normal appearing vulva with no masses, tenderness, or lesions  BUS/Urethra/Skene's glands:  Normal  Vagina:  Normal appearing with normal color and discharge, no lesions.  Cervix:  Normal appearing without discharge or lesions  Uterus:  Normal in size, shape and contour.  Midline and mobile, nontender  Adnexa/parametria:     Rt: Normal in size, without masses or tenderness.   Lt: Normal in  size, without masses or tenderness.  Anus and perineum: Normal  Patient informed chaperone available to be present for breast and pelvic exam. Patient has requested no chaperone to be present. Patient has been advised what will be completed during breast and pelvic exam.   Assessment/Plan:  27 y.o. G0 for annual exam.   Well female exam with routine gynecological exam - Plan: Cytology - PAP( [place]). Education provided on SBEs, importance of preventative screenings, current guidelines, high calcium diet, regular exercise, and multivitamin daily.   Encounter for surveillance of implantable subdermal contraceptive - Nexplanon 10/2020. Occasional spotting.   Screen for STD (sexually transmitted disease) - Plan: Cytology - PAP( [place]), RPR, HIV Antibody (routine testing w rflx). GC/chlamydia/trich added to pap.   Screening for cervical cancer - Normal pap history. Pap today.   Return in 1 year for annual.      Olivia Mackie DNP, 8:58 AM 11/18/2022

## 2022-11-24 DIAGNOSIS — R233 Spontaneous ecchymoses: Secondary | ICD-10-CM | POA: Diagnosis not present

## 2022-11-24 DIAGNOSIS — J329 Chronic sinusitis, unspecified: Secondary | ICD-10-CM | POA: Diagnosis not present

## 2022-11-24 DIAGNOSIS — R059 Cough, unspecified: Secondary | ICD-10-CM | POA: Diagnosis not present

## 2023-02-03 ENCOUNTER — Ambulatory Visit: Payer: Self-pay | Admitting: Nurse Practitioner
